# Patient Record
Sex: Female | Born: 1965 | Race: Black or African American | Hispanic: No | Marital: Single | Smoking: Never smoker
Health system: Southern US, Community
[De-identification: ages and names within clinical notes are randomized; demographics above are authoritative.]

## PROBLEM LIST (undated history)

## (undated) DIAGNOSIS — D649 Anemia, unspecified: Secondary | ICD-10-CM

## (undated) DIAGNOSIS — I10 Essential (primary) hypertension: Secondary | ICD-10-CM

---

## 2008-04-12 ENCOUNTER — Ambulatory Visit: Payer: Self-pay | Admitting: Unknown Physician Specialty

## 2009-04-27 ENCOUNTER — Ambulatory Visit: Payer: Self-pay | Admitting: Unknown Physician Specialty

## 2010-06-05 ENCOUNTER — Ambulatory Visit: Payer: Self-pay | Admitting: Unknown Physician Specialty

## 2011-07-17 ENCOUNTER — Ambulatory Visit: Payer: Self-pay | Admitting: Unknown Physician Specialty

## 2012-07-20 ENCOUNTER — Ambulatory Visit: Payer: Self-pay | Admitting: Unknown Physician Specialty

## 2013-07-21 ENCOUNTER — Ambulatory Visit: Payer: Self-pay | Admitting: Internal Medicine

## 2013-07-28 ENCOUNTER — Ambulatory Visit: Payer: Self-pay | Admitting: Internal Medicine

## 2014-07-28 ENCOUNTER — Ambulatory Visit: Payer: Self-pay | Admitting: Family Medicine

## 2016-06-07 ENCOUNTER — Other Ambulatory Visit: Payer: Self-pay | Admitting: Nurse Practitioner

## 2016-06-07 DIAGNOSIS — Z1231 Encounter for screening mammogram for malignant neoplasm of breast: Secondary | ICD-10-CM

## 2016-07-15 ENCOUNTER — Ambulatory Visit
Admission: RE | Admit: 2016-07-15 | Discharge: 2016-07-15 | Disposition: A | Discharge status: Home / Self Care | Payer: Managed Care, Other (non HMO) | Source: Ambulatory Visit | Attending: Nurse Practitioner | Admitting: Nurse Practitioner

## 2016-07-15 DIAGNOSIS — Z1231 Encounter for screening mammogram for malignant neoplasm of breast: Secondary | ICD-10-CM | POA: Diagnosis not present

## 2017-06-23 DIAGNOSIS — I10 Essential (primary) hypertension: Secondary | ICD-10-CM | POA: Diagnosis not present

## 2017-06-23 DIAGNOSIS — Z0000 Encounter for general adult medical examination without abnormal findings: Secondary | ICD-10-CM | POA: Diagnosis not present

## 2017-06-23 DIAGNOSIS — Z79899 Other long term (current) drug therapy: Secondary | ICD-10-CM | POA: Diagnosis not present

## 2017-06-26 ENCOUNTER — Other Ambulatory Visit: Payer: Self-pay | Admitting: Nurse Practitioner

## 2017-06-26 DIAGNOSIS — Z1231 Encounter for screening mammogram for malignant neoplasm of breast: Secondary | ICD-10-CM

## 2017-07-24 ENCOUNTER — Encounter (HOSPITAL_COMMUNITY): Payer: Self-pay

## 2017-07-24 ENCOUNTER — Ambulatory Visit
Admission: RE | Admit: 2017-07-24 | Discharge: 2017-07-24 | Disposition: A | Discharge status: Home / Self Care | Payer: 59 | Source: Ambulatory Visit | Attending: Nurse Practitioner | Admitting: Nurse Practitioner

## 2017-07-24 DIAGNOSIS — Z1231 Encounter for screening mammogram for malignant neoplasm of breast: Secondary | ICD-10-CM | POA: Insufficient documentation

## 2017-11-10 DIAGNOSIS — Z8371 Family history of colonic polyps: Secondary | ICD-10-CM | POA: Diagnosis not present

## 2017-11-10 DIAGNOSIS — Z01818 Encounter for other preprocedural examination: Secondary | ICD-10-CM | POA: Diagnosis not present

## 2017-11-10 DIAGNOSIS — Z1211 Encounter for screening for malignant neoplasm of colon: Secondary | ICD-10-CM | POA: Diagnosis not present

## 2017-11-24 DIAGNOSIS — Z8371 Family history of colonic polyps: Secondary | ICD-10-CM | POA: Diagnosis not present

## 2017-11-24 DIAGNOSIS — K648 Other hemorrhoids: Secondary | ICD-10-CM | POA: Diagnosis not present

## 2017-11-24 DIAGNOSIS — Z1211 Encounter for screening for malignant neoplasm of colon: Secondary | ICD-10-CM | POA: Diagnosis not present

## 2017-11-24 DIAGNOSIS — K640 First degree hemorrhoids: Secondary | ICD-10-CM | POA: Diagnosis not present

## 2017-12-30 DIAGNOSIS — E876 Hypokalemia: Secondary | ICD-10-CM | POA: Diagnosis not present

## 2017-12-30 DIAGNOSIS — I10 Essential (primary) hypertension: Secondary | ICD-10-CM | POA: Diagnosis not present

## 2017-12-30 DIAGNOSIS — Z79899 Other long term (current) drug therapy: Secondary | ICD-10-CM | POA: Diagnosis not present

## 2017-12-30 DIAGNOSIS — E782 Mixed hyperlipidemia: Secondary | ICD-10-CM | POA: Diagnosis not present

## 2018-07-09 ENCOUNTER — Other Ambulatory Visit: Payer: Self-pay | Admitting: Nurse Practitioner

## 2018-07-09 DIAGNOSIS — Z0000 Encounter for general adult medical examination without abnormal findings: Secondary | ICD-10-CM | POA: Diagnosis not present

## 2018-07-09 DIAGNOSIS — I10 Essential (primary) hypertension: Secondary | ICD-10-CM | POA: Diagnosis not present

## 2018-07-09 DIAGNOSIS — Z1231 Encounter for screening mammogram for malignant neoplasm of breast: Secondary | ICD-10-CM

## 2018-07-09 DIAGNOSIS — Z79899 Other long term (current) drug therapy: Secondary | ICD-10-CM | POA: Diagnosis not present

## 2018-07-09 DIAGNOSIS — E782 Mixed hyperlipidemia: Secondary | ICD-10-CM | POA: Diagnosis not present

## 2018-07-31 ENCOUNTER — Ambulatory Visit
Admission: RE | Admit: 2018-07-31 | Discharge: 2018-07-31 | Disposition: A | Discharge status: Home / Self Care | Payer: 59 | Source: Ambulatory Visit | Attending: Nurse Practitioner | Admitting: Nurse Practitioner

## 2018-07-31 DIAGNOSIS — Z1231 Encounter for screening mammogram for malignant neoplasm of breast: Secondary | ICD-10-CM | POA: Diagnosis not present

## 2018-08-05 ENCOUNTER — Other Ambulatory Visit: Payer: Self-pay | Admitting: Nurse Practitioner

## 2018-08-05 DIAGNOSIS — N6310 Unspecified lump in the right breast, unspecified quadrant: Secondary | ICD-10-CM

## 2018-08-05 DIAGNOSIS — R928 Other abnormal and inconclusive findings on diagnostic imaging of breast: Secondary | ICD-10-CM

## 2018-08-07 ENCOUNTER — Ambulatory Visit
Admission: RE | Admit: 2018-08-07 | Discharge: 2018-08-07 | Disposition: A | Discharge status: Home / Self Care | Payer: 59 | Source: Ambulatory Visit | Attending: Nurse Practitioner | Admitting: Nurse Practitioner

## 2018-08-07 DIAGNOSIS — R928 Other abnormal and inconclusive findings on diagnostic imaging of breast: Secondary | ICD-10-CM | POA: Diagnosis present

## 2018-08-07 DIAGNOSIS — N6310 Unspecified lump in the right breast, unspecified quadrant: Secondary | ICD-10-CM | POA: Diagnosis present

## 2018-08-07 DIAGNOSIS — N6313 Unspecified lump in the right breast, lower outer quadrant: Secondary | ICD-10-CM | POA: Diagnosis not present

## 2018-08-07 DIAGNOSIS — R922 Inconclusive mammogram: Secondary | ICD-10-CM | POA: Diagnosis not present

## 2018-12-19 IMAGING — MG MM DIGITAL SCREENING BILAT W/ CAD
4 series · 4 of 4 positions shown · non-contrast
Comparison: Previous exam(s).

CLINICAL DATA: Screening.

EXAM:
DIGITAL SCREENING BILATERAL MAMMOGRAM WITH CAD

[L MLO]
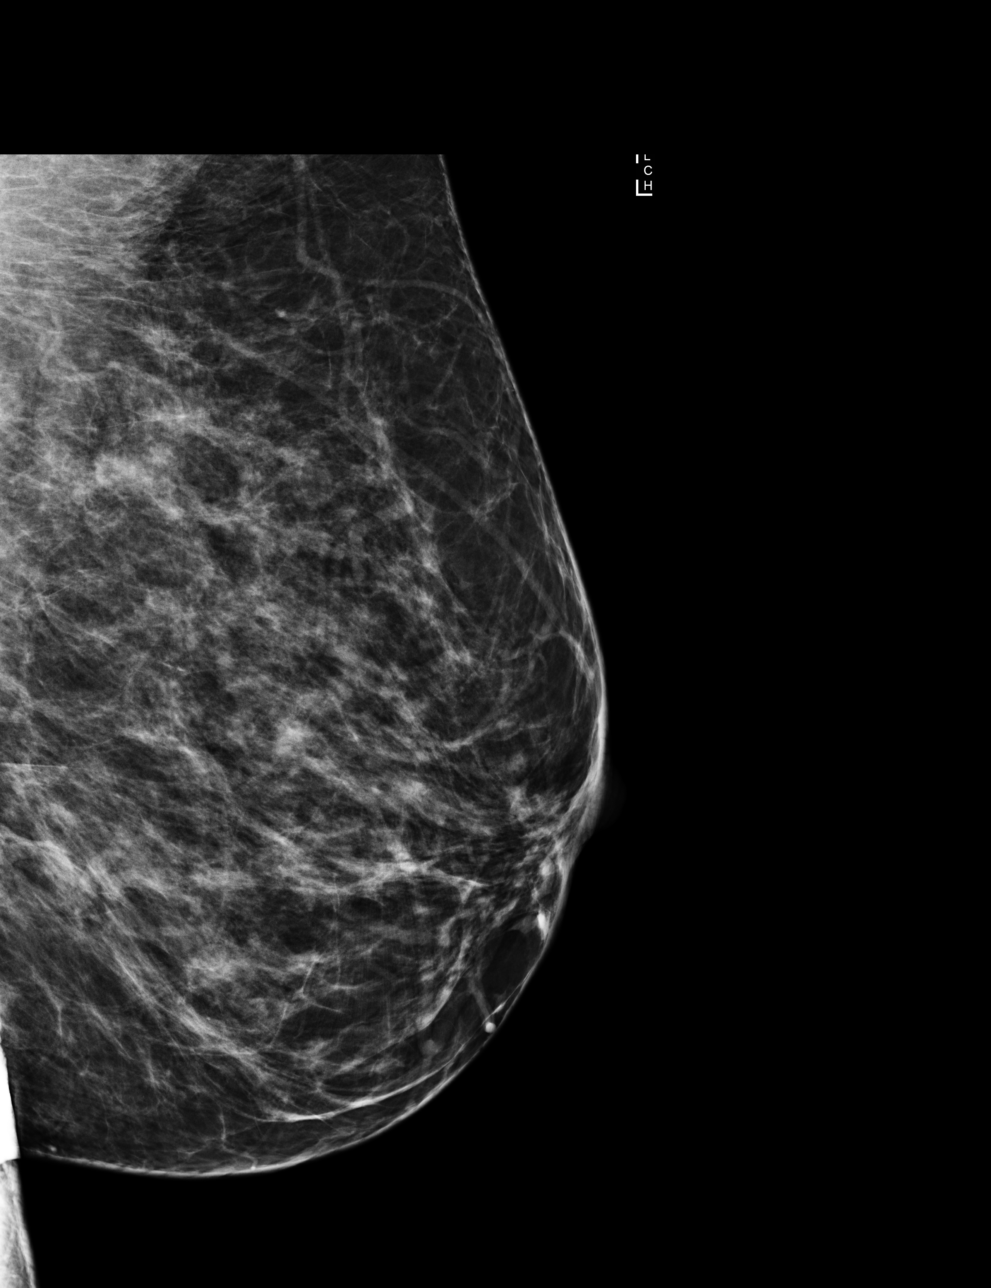

[R MLO]
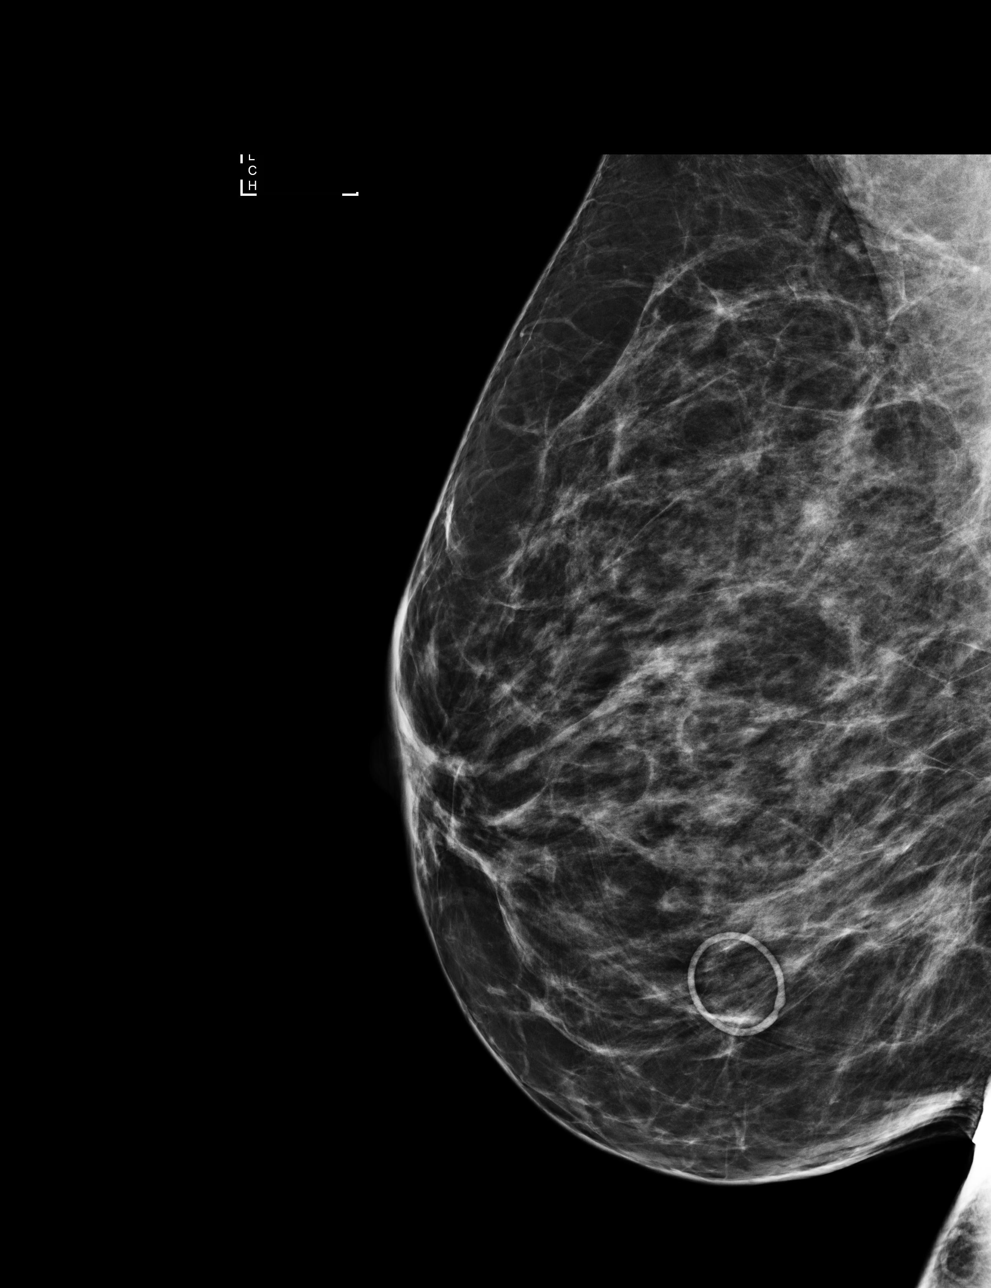

[L CC]
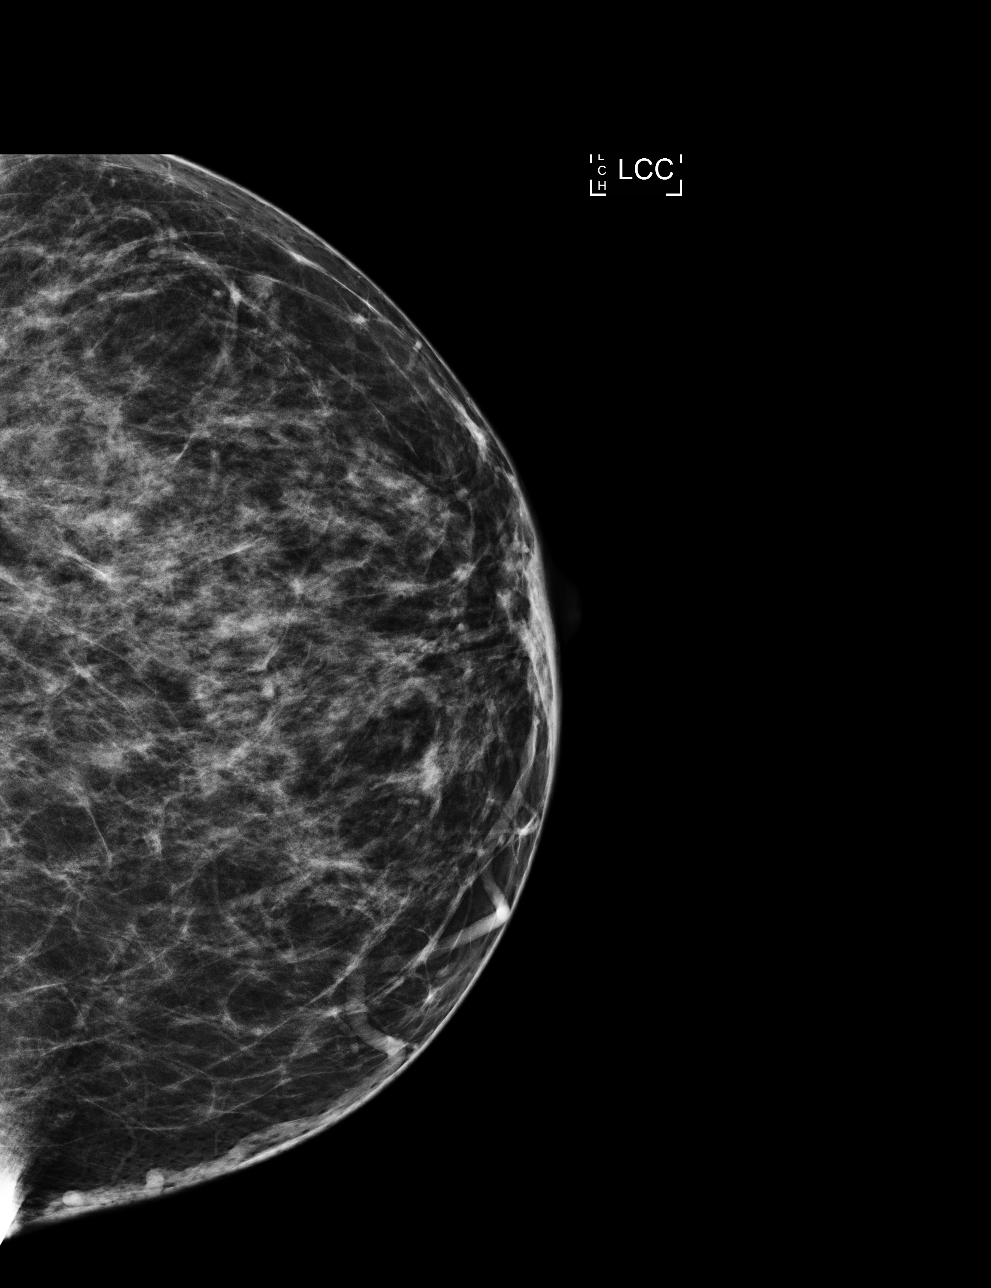

[R CC]
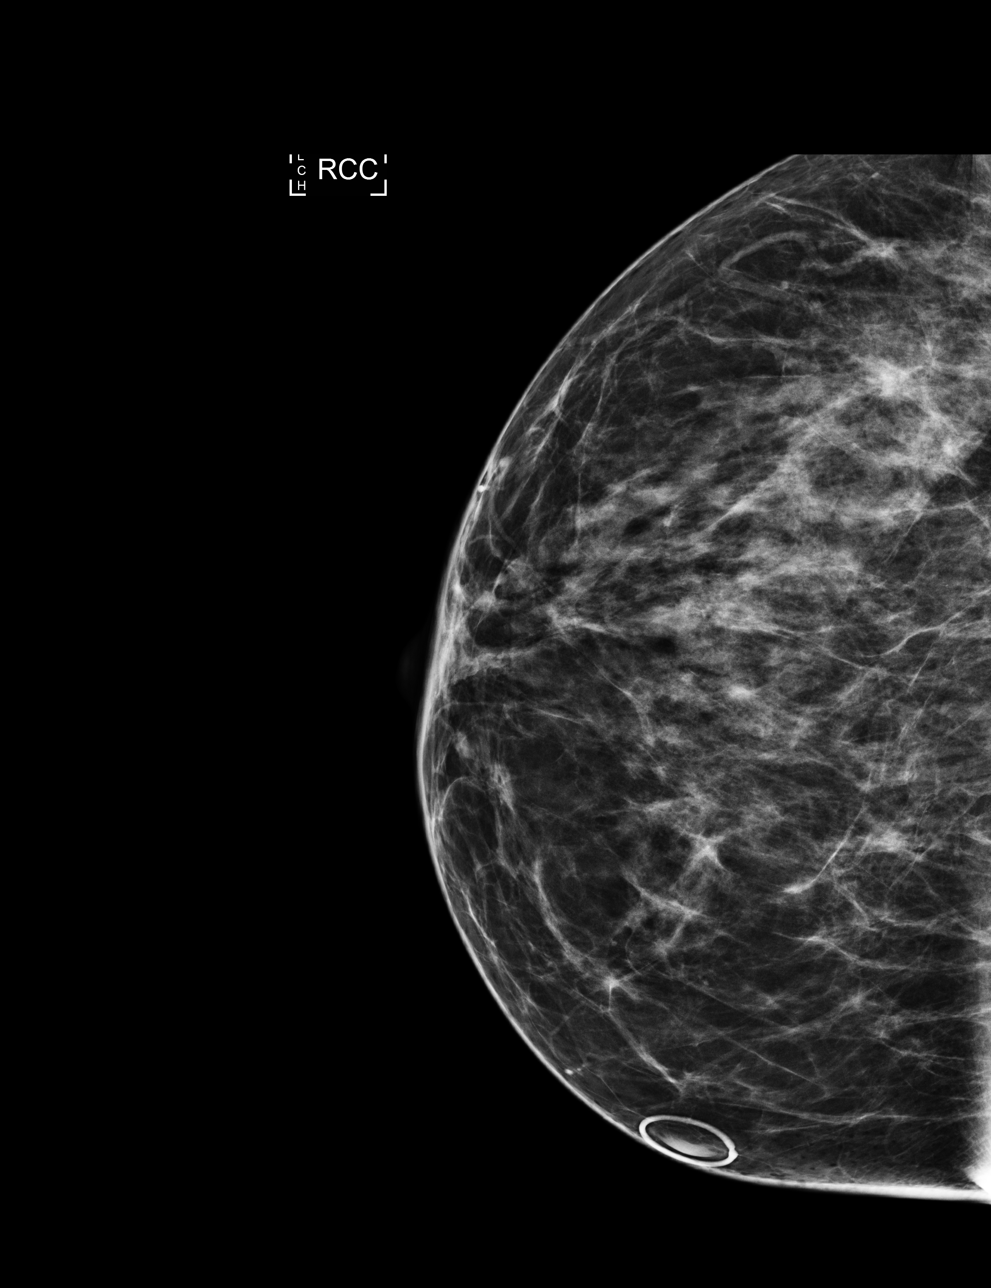

[4 of 4 positions shown; findings below may reference images not displayed]

ACR Breast Density Category c: The breast tissue is heterogeneously
dense, which may obscure small masses.
FINDINGS: There are no findings suspicious for malignancy. Images were
processed with CAD.
IMPRESSION: No mammographic evidence of malignancy. A result letter of this
screening mammogram will be mailed directly to the patient.

RECOMMENDATION:
Screening mammogram in one year. (Code:YJ-[id number])

BI-RADS CATEGORY  1: Negative.

## 2019-08-03 ENCOUNTER — Other Ambulatory Visit: Payer: Self-pay | Admitting: Nurse Practitioner

## 2019-08-03 DIAGNOSIS — Z1231 Encounter for screening mammogram for malignant neoplasm of breast: Secondary | ICD-10-CM

## 2019-08-12 ENCOUNTER — Ambulatory Visit
Admission: RE | Admit: 2019-08-12 | Discharge: 2019-08-12 | Disposition: A | Discharge status: Home / Self Care | Payer: 59 | Source: Ambulatory Visit | Attending: Nurse Practitioner | Admitting: Nurse Practitioner

## 2019-08-12 DIAGNOSIS — Z1231 Encounter for screening mammogram for malignant neoplasm of breast: Secondary | ICD-10-CM | POA: Diagnosis present

## 2019-08-16 ENCOUNTER — Other Ambulatory Visit: Payer: Self-pay | Admitting: Nurse Practitioner

## 2019-08-16 DIAGNOSIS — R928 Other abnormal and inconclusive findings on diagnostic imaging of breast: Secondary | ICD-10-CM

## 2019-08-17 ENCOUNTER — Other Ambulatory Visit: Payer: Self-pay | Admitting: Nurse Practitioner

## 2019-08-17 DIAGNOSIS — R928 Other abnormal and inconclusive findings on diagnostic imaging of breast: Secondary | ICD-10-CM

## 2019-08-17 DIAGNOSIS — N6320 Unspecified lump in the left breast, unspecified quadrant: Secondary | ICD-10-CM

## 2019-08-24 ENCOUNTER — Ambulatory Visit
Admission: RE | Admit: 2019-08-24 | Discharge: 2019-08-24 | Disposition: A | Discharge status: Home / Self Care | Payer: 59 | Source: Ambulatory Visit | Attending: Nurse Practitioner | Admitting: Nurse Practitioner

## 2019-08-24 DIAGNOSIS — N6320 Unspecified lump in the left breast, unspecified quadrant: Secondary | ICD-10-CM | POA: Diagnosis present

## 2019-08-24 DIAGNOSIS — R928 Other abnormal and inconclusive findings on diagnostic imaging of breast: Secondary | ICD-10-CM | POA: Diagnosis present

## 2019-08-30 ENCOUNTER — Other Ambulatory Visit: Payer: Self-pay | Admitting: Nurse Practitioner

## 2019-08-30 DIAGNOSIS — N6320 Unspecified lump in the left breast, unspecified quadrant: Secondary | ICD-10-CM

## 2020-02-08 ENCOUNTER — Other Ambulatory Visit: Payer: Self-pay

## 2020-02-08 ENCOUNTER — Ambulatory Visit
Admission: RE | Admit: 2020-02-08 | Discharge: 2020-02-08 | Disposition: A | Discharge status: Home / Self Care | Payer: 59 | Source: Ambulatory Visit | Attending: Nurse Practitioner | Admitting: Nurse Practitioner

## 2020-02-08 DIAGNOSIS — N6320 Unspecified lump in the left breast, unspecified quadrant: Secondary | ICD-10-CM | POA: Insufficient documentation

## 2020-02-10 ENCOUNTER — Other Ambulatory Visit: Payer: Self-pay | Admitting: Nurse Practitioner

## 2020-02-10 DIAGNOSIS — N63 Unspecified lump in unspecified breast: Secondary | ICD-10-CM

## 2020-02-10 DIAGNOSIS — Z1231 Encounter for screening mammogram for malignant neoplasm of breast: Secondary | ICD-10-CM

## 2020-02-10 DIAGNOSIS — R928 Other abnormal and inconclusive findings on diagnostic imaging of breast: Secondary | ICD-10-CM

## 2020-07-08 HISTORY — PX: BREAST EXCISIONAL BIOPSY: SUR124

## 2020-08-30 ENCOUNTER — Ambulatory Visit
Admission: RE | Admit: 2020-08-30 | Discharge: 2020-08-30 | Disposition: A | Discharge status: Home / Self Care | Payer: 59 | Source: Ambulatory Visit | Attending: Nurse Practitioner | Admitting: Nurse Practitioner

## 2020-08-30 ENCOUNTER — Other Ambulatory Visit: Payer: Self-pay

## 2020-08-30 DIAGNOSIS — R928 Other abnormal and inconclusive findings on diagnostic imaging of breast: Secondary | ICD-10-CM

## 2020-08-30 DIAGNOSIS — N63 Unspecified lump in unspecified breast: Secondary | ICD-10-CM | POA: Diagnosis present

## 2020-08-30 DIAGNOSIS — Z1231 Encounter for screening mammogram for malignant neoplasm of breast: Secondary | ICD-10-CM | POA: Diagnosis present

## 2020-09-01 ENCOUNTER — Other Ambulatory Visit: Payer: Self-pay | Admitting: Nurse Practitioner

## 2020-09-01 DIAGNOSIS — R928 Other abnormal and inconclusive findings on diagnostic imaging of breast: Secondary | ICD-10-CM

## 2020-09-01 DIAGNOSIS — N6320 Unspecified lump in the left breast, unspecified quadrant: Secondary | ICD-10-CM

## 2020-09-05 HISTORY — PX: BREAST BIOPSY: SHX20

## 2020-09-14 ENCOUNTER — Ambulatory Visit
Admission: RE | Admit: 2020-09-14 | Discharge: 2020-09-14 | Disposition: A | Discharge status: Home / Self Care | Payer: 59 | Source: Ambulatory Visit | Attending: Nurse Practitioner | Admitting: Nurse Practitioner

## 2020-09-14 ENCOUNTER — Other Ambulatory Visit: Payer: Self-pay

## 2020-09-14 DIAGNOSIS — R928 Other abnormal and inconclusive findings on diagnostic imaging of breast: Secondary | ICD-10-CM

## 2020-09-14 DIAGNOSIS — N6320 Unspecified lump in the left breast, unspecified quadrant: Secondary | ICD-10-CM | POA: Diagnosis present

## 2020-09-15 LAB — SURGICAL PATHOLOGY

## 2020-09-19 ENCOUNTER — Encounter: Payer: Self-pay | Admitting: *Deleted

## 2020-09-19 NOTE — Progress Notes (Signed)
Received message from Electa Sniff, RN that patient needed surgical consultation for an intraductal papilloma.  Called patient.  Her PCP is at [facility].  I have her scheduled to see Dr. Lysle Pearl on 09/26/20 @ 10:45.

## 2020-10-05 ENCOUNTER — Ambulatory Visit: Payer: Self-pay | Admitting: Surgery

## 2020-10-05 NOTE — H&P (Signed)
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 female who was referred by Sallee Lange, NP for evaluation of above. Change was noted on last screening mammogram. Patient does routinely do self breast exams.  Patient has not noted a change on breast exam. Age of menarche was 68. Patient denies hormonal therapy. Patient is G1P1. Age of first live birth was 22. Patient did not breast feed. Patient denies nipple discharge. Patient denies previous breast biopsy. Patient denies a personal history of breast cancer.   Past Medical History:  has a past medical history of B12 deficiency, History of abnormal cervical Pap smear (02/1987), Hypertension, Hypokalemia, IGT (impaired glucose tolerance) (08/2020), Iron deficiency anemia, Mixed hyperlipidemia (12/30/2017), and Obesity (BMI 30-39.9), unspecified.  Past Surgical History:  has a past surgical history that includes Cesarean section and Colonoscopy (11/24/2017).  Family History: family history includes Alcohol abuse in her father; Aortic aneurysm in her father; Diabetes type II in her paternal grandmother; High blood pressure (Hypertension) in her brother, father, mother, and paternal grandmother; Kidney disease in her brother; No Known Problems in her brother, sister, sister, and sister; Throat cancer in her father.  Social History:  reports that she has never smoked. She has never used smokeless tobacco. She reports previous alcohol use. She reports that she does not use drugs.  Current Medications: has a current medication list which includes the following prescription(s): aranelle (28), aspirin, chlorthalidone, cyanocobalamin (vitamin b-12), and potassium chloride.  Allergies:     Allergies as of 09/26/2020  . (No Known Allergies)    ROS:  A 15 point review of systems was performed and was negative except as noted in HPI   Objective:   BP (!) 142/79   Pulse 61   Ht 167.6 cm (5\' 6" )    Wt (!) 103 kg (227 lb)   BMI 36.64 kg/m   Constitutional :  alert, appears stated age, cooperative and no distress  Lymphatics/Throat:  no asymmetry, masses, or scars  Respiratory:  clear to auscultation bilaterally  Cardiovascular:  regular rate and rhythm  Gastrointestinal: soft, non-tender; bowel sounds normal; no masses,  no organomegaly.   Musculoskeletal: Steady gait and movement  Skin: Cool and moist  Psychiatric: Normal affect, non-agitated, not confused  Breast:  Chaperone present for exam.  breasts appear normal, no suspicious masses, no skin or nipple changes or axillary nodes.    LABS:  SURGICAL PATHOLOGY SURGICAL PATHOLOGY  CASE: [id number]  PATIENT: Olivia Randolph  Surgical Pathology Report      Specimen Submitted:  A. Breast, left   Clinical History: 7 mm mass. Suspect benign. Q-shaped clip placed  following ultrasound guided biopsy of LEFT breast at 3 o'clock.     DIAGNOSIS:  A. BREAST, LEFT AT 3:00, 7 CM FROM THE NIPPLE; ULTRASOUND-GUIDED CORE  NEEDLE BIOPSY:  - FRAGMENTS OF BENIGN INTRADUCTAL PAPILLOMA WITH FLORID USUAL DUCTAL  HYPERPLASIA.  - NO DEFINITE EVIDENCE OF INVASIVE CARCINOMA.   Comment:  Conservative excision is recommended to exclude a potential unsampled  atypical proliferative process.   GROSS DESCRIPTION:  A. Labeled: Left breast 3:00 7 cm from nipple  Received: Formalin  Time/date in fixative: Collected and placed in formalin at 8:16 AM on  09/14/2020  Cold ischemic time: Less than 1 minute  Total fixation time: 9 hours  Core pieces: 3  Size: Range from 1.2-1.4 cm in length and 0.2 cm in diameter  Description: Received are cores of yellow fibrofatty  tissue and  hemorrhage.  Ink color: Blue  Entirely submitted in 1 cassette.    RB 09/14/2020    Final Diagnosis performed by Allena Napoleon, MD.  Electronically signed  09/15/2020 8:42:56AM  The electronic signature indicates that the named Attending Pathologist  has evaluated  the specimen  Technical component performed at [facility], [address], [place],  [place] [address] Lab: [phone] Dir: Rush Farmer, MD, MMM  Professional component performed at [facility], [facility], [place], [place], [place] [address] Lab: [phone]       RADS: ADDENDUM REPORT: 09/15/2020 14:17  ADDENDUM: PATHOLOGY revealed: A. BREAST, LEFT AT 3:00, 7 CM FROM THE NIPPLE; ULTRASOUND-GUIDED CORE NEEDLE BIOPSY: - FRAGMENTS OF BENIGN INTRADUCTAL PAPILLOMA WITH FLORID USUAL DUCTAL HYPERPLASIA. - NO DEFINITE EVIDENCE OF INVASIVE CARCINOMA. Comment: Conservative excision is recommended to exclude a potential unsampled atypical proliferative process.  Pathology results are CONCORDANT with imaging findings, per Dr. Lajean Manes.  Pathology results and recommendations below were discussed with patient by telephone on 09/15/2020. Patient reported biopsy site within normal limits with slight tenderness at the site, and no significant bruising. Post biopsy care instructions were reviewed, questions were answered and my direct phone number was provided to patient. Patient was instructed to call [facility] if any concerns or questions arise related to the biopsy.  Recommend surgical consultation for possible excision: Request for surgical consultation relayed to Al Pimple RN and Tanya Nones RN at [facility] by Electa Sniff RN on 09/15/2020.  Pathology results reported by Electa Sniff RN on 09/15/2020.   Electronically Signed By: Lajean Manes M.D. On: 09/15/2020 14:17 CLINICAL DATA: One year follow-up of probably benign left breast 3  o'clock mass.   EXAM:  DIGITAL DIAGNOSTIC BILATERAL MAMMOGRAM WITH TOMOSYNTHESIS AND CAD;  ULTRASOUND LEFT BREAST LIMITED   TECHNIQUE:  Bilateral digital diagnostic mammography and breast tomosynthesis  was performed. The images were evaluated with computer-aided   detection.; Targeted ultrasound examination of the left breast was  performed   COMPARISON: Previous exam(s).   ACR Breast Density Category c: The breast tissue is heterogeneously  dense, which may obscure small masses.   FINDINGS:  Mammographically, there are no new suspicious masses, areas of  architectural distortion or microcalcifications in either breast.  There is a stable circumscribed low-density mass in the left 3  o'clock breast, middle depth.   Targeted left breast ultrasound was performed showing 3 o'clock 7 cm  from the nipple largely stable in size hypoechoic mass measuring 0.6  x 0.5 x 0.7 cm. However border irregularity is noted on the radial  view, which represents a change from patient's prior ultrasounds.   IMPRESSION:  Largely stable in size but slightly irregular left breast 3 o'clock  mass, for which ultrasound-guided core needle biopsy is recommended.   RECOMMENDATION:  One site ultrasound-guided core needle biopsy of the left breast.   I have discussed the findings and recommendations with the patient.  If applicable, a reminder letter will be sent to the patient  regarding the next appointment.   BI-RADS CATEGORY 3: Probably benign.    Electronically Signed  By: Fidela Salisbury M.D.    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  Plan:   1. 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 The risks of general anesthetic, if used, includes MI, CVA, sudden death or even reaction to anesthetic medications also discussed.  Typical post-op recovery time and possbility of activity restrictions were also discussed.  Alternatives  include continued observation.  Benefits include possible symptom relief, pathologic evaluation, and/or curative excision.   The patient verbalized understanding and all questions were answered to the patient's satisfaction.  2. Pt will like to think over proceeding with excisional biopsy at this time.  Will f/u in 15mo for diagnostic mammogram with primary or our office if she will like to continue monitoring.

## 2020-10-05 NOTE — H&P (View-Only) (Signed)
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 female who was referred by Sallee Lange, NP for evaluation of above. Change was noted on last screening mammogram. Patient does routinely do self breast exams.  Patient has not noted a change on breast exam. Age of menarche was 56. Patient denies hormonal therapy. Patient is G1P1. Age of first live birth was 59. Patient did not breast feed. Patient denies nipple discharge. Patient denies previous breast biopsy. Patient denies a personal history of breast cancer.   Past Medical History:  has a past medical history of B12 deficiency, History of abnormal cervical Pap smear (02/1987), Hypertension, Hypokalemia, IGT (impaired glucose tolerance) (08/2020), Iron deficiency anemia, Mixed hyperlipidemia (12/30/2017), and Obesity (BMI 30-39.9), unspecified.  Past Surgical History:  has a past surgical history that includes Cesarean section and Colonoscopy (11/24/2017).  Family History: family history includes Alcohol abuse in her father; Aortic aneurysm in her father; Diabetes type II in her paternal grandmother; High blood pressure (Hypertension) in her brother, father, mother, and paternal grandmother; Kidney disease in her brother; No Known Problems in her brother, sister, sister, and sister; Throat cancer in her father.  Social History:  reports that she has never smoked. She has never used smokeless tobacco. She reports previous alcohol use. She reports that she does not use drugs.  Current Medications: has a current medication list which includes the following prescription(s): aranelle (28), aspirin, chlorthalidone, cyanocobalamin (vitamin b-12), and potassium chloride.  Allergies:     Allergies as of 09/26/2020  . (No Known Allergies)    ROS:  A 15 point review of systems was performed and was negative except as noted in HPI   Objective:   BP (!) 142/79   Pulse 61   Ht 167.6 cm (5\' 6" )    Wt (!) 103 kg (227 lb)   BMI 36.64 kg/m   Constitutional :  alert, appears stated age, cooperative and no distress  Lymphatics/Throat:  no asymmetry, masses, or scars  Respiratory:  clear to auscultation bilaterally  Cardiovascular:  regular rate and rhythm  Gastrointestinal: soft, non-tender; bowel sounds normal; no masses,  no organomegaly.   Musculoskeletal: Steady gait and movement  Skin: Cool and moist  Psychiatric: Normal affect, non-agitated, not confused  Breast:  Chaperone present for exam.  breasts appear normal, no suspicious masses, no skin or nipple changes or axillary nodes.    LABS:  SURGICAL PATHOLOGY SURGICAL PATHOLOGY  CASE: [id number]  PATIENT: Olivia Randolph  Surgical Pathology Report      Specimen Submitted:  A. Breast, left   Clinical History: 7 mm mass. Suspect benign. Q-shaped clip placed  following ultrasound guided biopsy of LEFT breast at 3 o'clock.     DIAGNOSIS:  A. BREAST, LEFT AT 3:00, 7 CM FROM THE NIPPLE; ULTRASOUND-GUIDED CORE  NEEDLE BIOPSY:  - FRAGMENTS OF BENIGN INTRADUCTAL PAPILLOMA WITH FLORID USUAL DUCTAL  HYPERPLASIA.  - NO DEFINITE EVIDENCE OF INVASIVE CARCINOMA.   Comment:  Conservative excision is recommended to exclude a potential unsampled  atypical proliferative process.   GROSS DESCRIPTION:  A. Labeled: Left breast 3:00 7 cm from nipple  Received: Formalin  Time/date in fixative: Collected and placed in formalin at 8:16 AM on  09/14/2020  Cold ischemic time: Less than 1 minute  Total fixation time: 9 hours  Core pieces: 3  Size: Range from 1.2-1.4 cm in length and 0.2 cm in diameter  Description: Received are cores of yellow fibrofatty  tissue and  hemorrhage.  Ink color: Blue  Entirely submitted in 1 cassette.    RB 09/14/2020    Final Diagnosis performed by Allena Napoleon, MD.  Electronically signed  09/15/2020 8:42:56AM  The electronic signature indicates that the named Attending Pathologist  has evaluated  the specimen  Technical component performed at [facility], [address], [place],  [place] [address] Lab: [phone] Dir: Rush Farmer, MD, MMM  Professional component performed at [facility], [facility], [place], [place], [place] [address] Lab: [phone]       RADS: ADDENDUM REPORT: 09/15/2020 14:17  ADDENDUM: PATHOLOGY revealed: A. BREAST, LEFT AT 3:00, 7 CM FROM THE NIPPLE; ULTRASOUND-GUIDED CORE NEEDLE BIOPSY: - FRAGMENTS OF BENIGN INTRADUCTAL PAPILLOMA WITH FLORID USUAL DUCTAL HYPERPLASIA. - NO DEFINITE EVIDENCE OF INVASIVE CARCINOMA. Comment: Conservative excision is recommended to exclude a potential unsampled atypical proliferative process.  Pathology results are CONCORDANT with imaging findings, per Dr. Lajean Manes.  Pathology results and recommendations below were discussed with patient by telephone on 09/15/2020. Patient reported biopsy site within normal limits with slight tenderness at the site, and no significant bruising. Post biopsy care instructions were reviewed, questions were answered and my direct phone number was provided to patient. Patient was instructed to call [facility] if any concerns or questions arise related to the biopsy.  Recommend surgical consultation for possible excision: Request for surgical consultation relayed to Al Pimple RN and Tanya Nones RN at [facility] by Electa Sniff RN on 09/15/2020.  Pathology results reported by Electa Sniff RN on 09/15/2020.   Electronically Signed By: Lajean Manes M.D. On: 09/15/2020 14:17 CLINICAL DATA: One year follow-up of probably benign left breast 3  o'clock mass.   EXAM:  DIGITAL DIAGNOSTIC BILATERAL MAMMOGRAM WITH TOMOSYNTHESIS AND CAD;  ULTRASOUND LEFT BREAST LIMITED   TECHNIQUE:  Bilateral digital diagnostic mammography and breast tomosynthesis  was performed. The images were evaluated with computer-aided   detection.; Targeted ultrasound examination of the left breast was  performed   COMPARISON: Previous exam(s).   ACR Breast Density Category c: The breast tissue is heterogeneously  dense, which may obscure small masses.   FINDINGS:  Mammographically, there are no new suspicious masses, areas of  architectural distortion or microcalcifications in either breast.  There is a stable circumscribed low-density mass in the left 3  o'clock breast, middle depth.   Targeted left breast ultrasound was performed showing 3 o'clock 7 cm  from the nipple largely stable in size hypoechoic mass measuring 0.6  x 0.5 x 0.7 cm. However border irregularity is noted on the radial  view, which represents a change from patient's prior ultrasounds.   IMPRESSION:  Largely stable in size but slightly irregular left breast 3 o'clock  mass, for which ultrasound-guided core needle biopsy is recommended.   RECOMMENDATION:  One site ultrasound-guided core needle biopsy of the left breast.   I have discussed the findings and recommendations with the patient.  If applicable, a reminder letter will be sent to the patient  regarding the next appointment.   BI-RADS CATEGORY 3: Probably benign.    Electronically Signed  By: Fidela Salisbury M.D.    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  Plan:   1. 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 The risks of general anesthetic, if used, includes MI, CVA, sudden death or even reaction to anesthetic medications also discussed.  Typical post-op recovery time and possbility of activity restrictions were also discussed.  Alternatives  include continued observation.  Benefits include possible symptom relief, pathologic evaluation, and/or curative excision.   The patient verbalized understanding and all questions were answered to the patient's satisfaction.  2. Pt will like to think over proceeding with excisional biopsy at this time.  Will f/u in 60mo for diagnostic mammogram with primary or our office if she will like to continue monitoring.

## 2020-10-18 ENCOUNTER — Other Ambulatory Visit: Payer: Self-pay | Admitting: Surgery

## 2020-10-18 DIAGNOSIS — D242 Benign neoplasm of left breast: Secondary | ICD-10-CM

## 2020-10-19 ENCOUNTER — Other Ambulatory Visit: Payer: Self-pay

## 2020-10-19 ENCOUNTER — Ambulatory Visit
Admission: RE | Admit: 2020-10-19 | Discharge: 2020-10-19 | Disposition: A | Discharge status: Home / Self Care | Payer: 59 | Source: Ambulatory Visit | Attending: Surgery | Admitting: Surgery

## 2020-10-19 DIAGNOSIS — D242 Benign neoplasm of left breast: Secondary | ICD-10-CM | POA: Diagnosis not present

## 2020-10-23 ENCOUNTER — Other Ambulatory Visit
Admission: RE | Admit: 2020-10-23 | Discharge: 2020-10-23 | Disposition: A | Discharge status: Home / Self Care | Payer: 59 | Source: Ambulatory Visit | Attending: Surgery | Admitting: Surgery

## 2020-10-23 ENCOUNTER — Other Ambulatory Visit: Payer: Self-pay

## 2020-10-23 HISTORY — DX: Essential (primary) hypertension: I10

## 2020-10-23 HISTORY — DX: Anemia, unspecified: D64.9

## 2020-10-23 NOTE — Patient Instructions (Signed)
Your procedure is scheduled on: Thursday October 26, 2020. Report to Day Surgery inside [facility] 2nd floor (stop by admissions desk first before getting on elevator). To find out your arrival time please call [phone] between 1PM - 3PM on Wednesday October 25, 2020.  Remember: Instructions that are not followed completely may result in serious medical risk,  up to and including death, or upon the discretion of your surgeon and anesthesiologist your  surgery may need to be rescheduled.     _X__ 1. Do not eat food after midnight the night before your procedure.                 No chewing gum or hard candies. You may drink clear liquids up to 2 hours                 before you are scheduled to arrive for your surgery- DO not drink clear                 liquids within 2 hours of the start of your surgery.                 Clear Liquids include:  water, apple juice without pulp, clear Gatorade, G2 or                  Gatorade Zero (avoid Red/Purple/Blue), Black Coffee or Tea (Do not add                 anything to coffee or tea).  __X__2.  On the morning of surgery brush your teeth with toothpaste and water, you                may rinse your mouth with mouthwash if you wish.  Do not swallow any toothpaste of mouthwash.     _X__ 3.  No Alcohol for 24 hours before or after surgery.   _X__ 4.  Do Not Smoke or use e-cigarettes For 24 Hours Prior to Your Surgery.                 Do not use any chewable tobacco products for at least 6 hours prior to                 Surgery.  _X__  5.  Do not use any recreational drugs (marijuana, cocaine, heroin, ecstasy, MDMA or other)                For at least one week prior to your surgery.  Combination of these drugs with anesthesia                May have life threatening results.  __X__ 6.  Notify your doctor if there is any change in your medical condition      (cold, fever, infections).     Do not wear jewelry, make-up,  hairpins, clips or nail polish. Do not wear lotions, powders, or perfumes. You may wear deodorant. Do not shave 48 hours prior to surgery.  Do not bring valuables to the hospital.    [facility] is not responsible for any belongings or valuables.  Contacts, dentures or bridgework may not be worn into surgery. Leave your suitcase in the car. After surgery it may be brought to your room. For patients admitted to the hospital, discharge time is determined by your treatment team.   Patients discharged the day of surgery will not be allowed to drive home.   Make arrangements  for someone to be with you for the first 24 hours of your Same Day Discharge.   __X__ Take these medicines the morning of surgery with A SIP OF WATER:    1. None   2.   3.   4.  5.  6.  ____ Fleet Enema (as directed)   __X__ Use CHG Soap (or wipes) as directed  ____ Use Benzoyl Peroxide Gel as instructed  ____ Use inhalers on the day of surgery  ____ Stop metformin 2 days prior to surgery    ____ Take 1/2 of usual insulin dose the night before surgery. No insulin the morning          of surgery.   __X__ Stop aspirin EC 81 MG as instructed.  __X__ Stop Anti-inflammatories such as Ibuprofen, Aleve, Advil, motrin, Goody's or BC powders.   __X__ Stop supplements until after surgery.    __X__ Do not start any herbal supplements before your procedure.    If you have any questions regarding your pre-procedure instructions,  Please call Pre-admit Testing at [phone].

## 2020-10-24 ENCOUNTER — Other Ambulatory Visit
Admission: RE | Admit: 2020-10-24 | Discharge: 2020-10-24 | Disposition: A | Discharge status: Home / Self Care | Payer: 59 | Source: Ambulatory Visit | Attending: Surgery | Admitting: Surgery

## 2020-10-24 DIAGNOSIS — I10 Essential (primary) hypertension: Secondary | ICD-10-CM | POA: Diagnosis not present

## 2020-10-24 DIAGNOSIS — Z01818 Encounter for other preprocedural examination: Secondary | ICD-10-CM | POA: Insufficient documentation

## 2020-10-24 LAB — BASIC METABOLIC PANEL
Anion gap: 9 (ref 5–15)
BUN: 16 mg/dL (ref 6–20)
CO2: 27 mmol/L (ref 22–32)
Calcium: 8.9 mg/dL (ref 8.9–10.3)
Chloride: 101 mmol/L (ref 98–111)
Creatinine, Ser: 0.83 mg/dL (ref 0.44–1.00)
GFR, Estimated: >60 mL/min (ref >60)
Glucose, Bld: 91 mg/dL (ref 70–99)
Potassium: 3 mmol/L — ABNORMAL LOW (ref 3.5–5.1)
Sodium: 137 mmol/L (ref 135–145)

## 2020-10-24 LAB — CBC
HCT: 36.7 % (ref 36.0–46.0)
Hemoglobin: 12.1 g/dL (ref 12.0–15.0)
MCH: 28.1 pg (ref 26.0–34.0)
MCHC: 33 g/dL (ref 30.0–36.0)
MCV: 85.2 fL (ref 80.0–100.0)
Platelets: 293 10*3/uL (ref 150–400)
RBC: 4.31 MIL/uL (ref 3.87–5.11)
RDW: 14.2 % (ref 11.5–15.5)
WBC: 7 10*3/uL (ref 4.0–10.5)
nRBC: 0 % (ref 0.0–0.2)

## 2020-10-24 NOTE — Progress Notes (Signed)
  [facility] Perioperative Services: Pre-Admission/Anesthesia Testing  Abnormal Lab Notification   Date: 10/24/20  Name: Olivia Randolph MRN:   [id number]  Re: Abnormal labs noted during PAT appointment   Provider(s) Notified: Benjamine Sprague, DO Notification mode: Routed and/or faxed via [facility] LAB VALUE(S): Lab Results  Component Value Date   K 3.0 (L) 10/24/2020   Notes:  Patient is scheduled for a BREAT BIOPSY on 10/26/2020. In review of patient's medication record it is noted that she is on daily chlorthalidone. Additionally, patient already taking oral K+ supplement (KLOR-CON 20 mEQ) daily. Will forward to primary attending surgery for review and optimization. Order entered to have K+ rechecked on the day of surgery to ensure correction of the noted electrolyte derangement.   This is a Community education officer; no formal response is required.  Honor Loh, MSN, APRN, FNP-C, CEN [facility]  Peri-operative Services Nurse Practitioner Phone: [phone] Fax: [phone] 10/24/20 2:30 PM

## 2020-10-26 ENCOUNTER — Ambulatory Visit
Admission: RE | Admit: 2020-10-26 | Discharge: 2020-10-26 | Disposition: A | Discharge status: Home / Self Care | Payer: 59 | Source: Ambulatory Visit | Attending: Surgery | Admitting: Surgery

## 2020-10-26 ENCOUNTER — Ambulatory Visit
Admission: RE | Admit: 2020-10-26 | Discharge: 2020-10-26 | Disposition: A | Discharge status: Home Health | Payer: 59 | Source: Ambulatory Visit | Attending: Surgery | Admitting: Surgery

## 2020-10-26 ENCOUNTER — Other Ambulatory Visit: Payer: Self-pay

## 2020-10-26 ENCOUNTER — Ambulatory Visit: Payer: 59 | Admitting: Urgent Care

## 2020-10-26 ENCOUNTER — Encounter: Payer: Self-pay | Admitting: Surgery

## 2020-10-26 ENCOUNTER — Encounter
Admission: RE | Disposition: A | Discharge status: Home Health | Payer: Self-pay | Source: Ambulatory Visit | Attending: Surgery

## 2020-10-26 DIAGNOSIS — D242 Benign neoplasm of left breast: Secondary | ICD-10-CM | POA: Diagnosis not present

## 2020-10-26 DIAGNOSIS — Z7982 Long term (current) use of aspirin: Secondary | ICD-10-CM | POA: Insufficient documentation

## 2020-10-26 DIAGNOSIS — Z79899 Other long term (current) drug therapy: Secondary | ICD-10-CM | POA: Insufficient documentation

## 2020-10-26 DIAGNOSIS — Z793 Long term (current) use of hormonal contraceptives: Secondary | ICD-10-CM | POA: Insufficient documentation

## 2020-10-26 DIAGNOSIS — N63 Unspecified lump in unspecified breast: Secondary | ICD-10-CM

## 2020-10-26 HISTORY — PX: BREAST BIOPSY WITH RADIO FREQUENCY LOCALIZER: SHX6895

## 2020-10-26 LAB — POCT I-STAT, CHEM 8
BUN: 18 mg/dL (ref 6–20)
Calcium, Ion: 1.12 mmol/L — ABNORMAL LOW (ref 1.15–1.40)
Chloride: 101 mmol/L (ref 98–111)
Creatinine, Ser: 0.8 mg/dL (ref 0.44–1.00)
Glucose, Bld: 81 mg/dL (ref 70–99)
HCT: 37 % (ref 36.0–46.0)
Hemoglobin: 12.6 g/dL (ref 12.0–15.0)
Potassium: 2.7 mmol/L — CL (ref 3.5–5.1)
Sodium: 141 mmol/L (ref 135–145)
TCO2: 25 mmol/L (ref 22–32)

## 2020-10-26 LAB — POTASSIUM: Potassium: 3.1 mmol/L — ABNORMAL LOW (ref 3.5–5.1)

## 2020-10-26 LAB — POCT PREGNANCY, URINE: Preg Test, Ur: NEGATIVE

## 2020-10-26 SURGERY — BREAST BIOPSY WITH RADIO FREQUENCY LOCALIZER
Anesthesia: General | Site: Breast | Laterality: Left

## 2020-10-26 MED ORDER — BUPIVACAINE-EPINEPHRINE 0.5% -1:200000 IJ SOLN
INTRAMUSCULAR | Status: DC | PRN
  Administered 2020-10-26: 4.5 mL

## 2020-10-26 MED ORDER — CHLORHEXIDINE GLUCONATE 0.12 % MT SOLN
OROMUCOSAL | Status: AC
  Administered 2020-10-26: 15 mL via OROMUCOSAL
  Filled 2020-10-26: qty 15

## 2020-10-26 MED ORDER — ACETAMINOPHEN 10 MG/ML IV SOLN
INTRAVENOUS | Status: DC | PRN
  Administered 2020-10-26: 1000 mg via INTRAVENOUS

## 2020-10-26 MED ORDER — DEXAMETHASONE SODIUM PHOSPHATE 10 MG/ML IJ SOLN
INTRAMUSCULAR | Status: DC | PRN
  Administered 2020-10-26: 10 mg via INTRAVENOUS

## 2020-10-26 MED ORDER — DROPERIDOL 2.5 MG/ML IJ SOLN
0.6250 mg | Freq: Once | INTRAMUSCULAR | Status: DC | PRN
  Filled 2020-10-26: qty 2

## 2020-10-26 MED ORDER — MEPERIDINE HCL 50 MG/ML IJ SOLN: 6.2500 mg | INTRAMUSCULAR | Status: DC | PRN

## 2020-10-26 MED ORDER — ACETAMINOPHEN 10 MG/ML IV SOLN
INTRAVENOUS | Status: AC
  Filled 2020-10-26: qty 100

## 2020-10-26 MED ORDER — PROPOFOL 10 MG/ML IV BOLUS
INTRAVENOUS | Status: AC
  Filled 2020-10-26: qty 20

## 2020-10-26 MED ORDER — PROPOFOL 10 MG/ML IV BOLUS
INTRAVENOUS | Status: DC | PRN
  Administered 2020-10-26 (×2): 100 mg via INTRAVENOUS

## 2020-10-26 MED ORDER — FAMOTIDINE 20 MG PO TABS: 20.0000 mg | ORAL_TABLET | Freq: Once | ORAL | Status: AC

## 2020-10-26 MED ORDER — CEFAZOLIN SODIUM-DEXTROSE 2-4 GM/100ML-% IV SOLN
2.0000 g | INTRAVENOUS | Status: AC
  Administered 2020-10-26: 2 g via INTRAVENOUS

## 2020-10-26 MED ORDER — LIDOCAINE HCL (CARDIAC) PF 100 MG/5ML IV SOSY
PREFILLED_SYRINGE | INTRAVENOUS | Status: DC | PRN
  Administered 2020-10-26: 60 mg via INTRAVENOUS

## 2020-10-26 MED ORDER — DOCUSATE SODIUM 100 MG PO CAPS: 100.0000 mg | ORAL_CAPSULE | Freq: Two times a day (BID) | ORAL | 0 refills | Status: AC | PRN

## 2020-10-26 MED ORDER — ONDANSETRON HCL 4 MG/2ML IJ SOLN
INTRAMUSCULAR | Status: DC | PRN
  Administered 2020-10-26 (×2): 4 mg via INTRAVENOUS

## 2020-10-26 MED ORDER — MIDAZOLAM HCL 2 MG/2ML IJ SOLN
INTRAMUSCULAR | Status: DC | PRN
  Administered 2020-10-26: 3 mg via INTRAVENOUS
  Administered 2020-10-26: 1 mg via INTRAVENOUS

## 2020-10-26 MED ORDER — FENTANYL CITRATE (PF) 100 MCG/2ML IJ SOLN: 25.0000 ug | INTRAMUSCULAR | Status: DC | PRN

## 2020-10-26 MED ORDER — BUPIVACAINE-EPINEPHRINE (PF) 0.5% -1:200000 IJ SOLN
INTRAMUSCULAR | Status: AC
  Filled 2020-10-26: qty 30

## 2020-10-26 MED ORDER — LIDOCAINE HCL (PF) 1 % IJ SOLN
INTRAMUSCULAR | Status: AC
  Filled 2020-10-26: qty 30

## 2020-10-26 MED ORDER — IBUPROFEN 800 MG PO TABS: 800.0000 mg | ORAL_TABLET | Freq: Three times a day (TID) | ORAL | 0 refills | Status: AC | PRN

## 2020-10-26 MED ORDER — CHLORHEXIDINE GLUCONATE 0.12 % MT SOLN: 15.0000 mL | Freq: Once | OROMUCOSAL | Status: AC

## 2020-10-26 MED ORDER — MIDAZOLAM HCL 2 MG/2ML IJ SOLN
INTRAMUSCULAR | Status: AC
  Filled 2020-10-26: qty 4

## 2020-10-26 MED ORDER — PROMETHAZINE HCL 25 MG/ML IJ SOLN: 6.2500 mg | INTRAMUSCULAR | Status: DC | PRN

## 2020-10-26 MED ORDER — LIDOCAINE HCL (PF) 1 % IJ SOLN
INTRAMUSCULAR | Status: DC | PRN
  Administered 2020-10-26: 4.5 mL

## 2020-10-26 MED ORDER — FENTANYL CITRATE (PF) 100 MCG/2ML IJ SOLN
INTRAMUSCULAR | Status: AC
  Filled 2020-10-26: qty 2

## 2020-10-26 MED ORDER — HYDROCODONE-ACETAMINOPHEN 5-325 MG PO TABS: 1.0000 | ORAL_TABLET | Freq: Four times a day (QID) | ORAL | 0 refills | Status: AC | PRN

## 2020-10-26 MED ORDER — ACETAMINOPHEN 325 MG PO TABS: 325.0000 mg | ORAL_TABLET | ORAL | Status: DC | PRN

## 2020-10-26 MED ORDER — LACTATED RINGERS IV SOLN: INTRAVENOUS | Status: DC

## 2020-10-26 MED ORDER — HYDROCODONE-ACETAMINOPHEN 7.5-325 MG PO TABS: 1.0000 | ORAL_TABLET | Freq: Once | ORAL | Status: DC | PRN

## 2020-10-26 MED ORDER — FENTANYL CITRATE (PF) 100 MCG/2ML IJ SOLN
INTRAMUSCULAR | Status: DC | PRN
  Administered 2020-10-26: 25 ug via INTRAVENOUS
  Administered 2020-10-26: 50 ug via INTRAVENOUS
  Administered 2020-10-26: 25 ug via INTRAVENOUS

## 2020-10-26 MED ORDER — CEFAZOLIN SODIUM-DEXTROSE 2-4 GM/100ML-% IV SOLN
INTRAVENOUS | Status: AC
  Filled 2020-10-26: qty 100

## 2020-10-26 MED ORDER — ORAL CARE MOUTH RINSE: 15.0000 mL | Freq: Once | OROMUCOSAL | Status: AC

## 2020-10-26 MED ORDER — CHLORHEXIDINE GLUCONATE CLOTH 2 % EX PADS: 6.0000 | MEDICATED_PAD | Freq: Once | CUTANEOUS | Status: DC

## 2020-10-26 MED ORDER — ACETAMINOPHEN 325 MG PO TABS: 650.0000 mg | ORAL_TABLET | Freq: Three times a day (TID) | ORAL | 0 refills | Status: AC | PRN

## 2020-10-26 MED ORDER — FAMOTIDINE 20 MG PO TABS
ORAL_TABLET | ORAL | Status: AC
  Administered 2020-10-26: 20 mg via ORAL
  Filled 2020-10-26: qty 1

## 2020-10-26 MED ORDER — ACETAMINOPHEN 160 MG/5ML PO SOLN
325.0000 mg | ORAL | Status: DC | PRN
Start: 2020-10-26 — End: 2020-10-26
  Filled 2020-10-26: qty 20.3

## 2020-10-26 SURGICAL SUPPLY — 50 items
ADH SKN CLS APL DERMABOND .7 (GAUZE/BANDAGES/DRESSINGS) ×1
APL PRP STRL LF DISP 70% ISPRP (MISCELLANEOUS) ×1
APPLIER CLIP 11 MED OPEN (CLIP)
APR CLP MED 11 20 MLT OPN (CLIP)
BLADE SURG 15 STRL LF DISP TIS (BLADE) ×1 IMPLANT
BLADE SURG 15 STRL SS (BLADE) ×2
CANISTER SUCT 1200ML W/VALVE (MISCELLANEOUS) ×2 IMPLANT
CHLORAPREP W/TINT 26 (MISCELLANEOUS) ×2 IMPLANT
CLIP APPLIE 11 MED OPEN (CLIP) IMPLANT
CNTNR SPEC 2.5X3XGRAD LEK (MISCELLANEOUS) ×1
CONT SPEC 4OZ STER OR WHT (MISCELLANEOUS) ×1
CONT SPEC 4OZ STRL OR WHT (MISCELLANEOUS) ×1
CONTAINER SPEC 2.5X3XGRAD LEK (MISCELLANEOUS) ×1 IMPLANT
COVER WAND RF STERILE (DRAPES) ×2 IMPLANT
DERMABOND ADVANCED (GAUZE/BANDAGES/DRESSINGS) ×1
DERMABOND ADVANCED .7 DNX12 (GAUZE/BANDAGES/DRESSINGS) ×1 IMPLANT
DEVICE DSSCT PLSMBLD 3.0S LGHT (MISCELLANEOUS) ×1 IMPLANT
DEVICE DUBIN SPECIMEN MAMMOGRA (MISCELLANEOUS) ×2 IMPLANT
DRAPE LAPAROTOMY TRNSV 106X77 (MISCELLANEOUS) ×2 IMPLANT
ELECT CAUTERY BLADE TIP 2.5 (TIP) ×2
ELECT REM PT RETURN 9FT ADLT (ELECTROSURGICAL) ×2
ELECTRODE CAUTERY BLDE TIP 2.5 (TIP) ×1 IMPLANT
ELECTRODE REM PT RTRN 9FT ADLT (ELECTROSURGICAL) ×1 IMPLANT
GAUZE 4X4 16PLY RFD (DISPOSABLE) ×2 IMPLANT
GLOVE SURG SYN 6.5 ES PF (GLOVE) ×2 IMPLANT
GLOVE SURG UNDER POLY LF SZ7 (GLOVE) ×2 IMPLANT
GOWN STRL REUS W/ TWL LRG LVL3 (GOWN DISPOSABLE) ×3 IMPLANT
GOWN STRL REUS W/TWL LRG LVL3 (GOWN DISPOSABLE) ×6
JACKSON PRATT 10 (INSTRUMENTS) IMPLANT
KIT MARKER MARGIN INK (KITS) ×2 IMPLANT
KIT TURNOVER KIT A (KITS) ×2 IMPLANT
LABEL OR SOLS (LABEL) ×2 IMPLANT
LIGHT WAVEGUIDE WIDE FLAT (MISCELLANEOUS) IMPLANT
MANIFOLD NEPTUNE II (INSTRUMENTS) ×2 IMPLANT
MARKER MARGIN CORRECT CLIP (MARKER) ×2 IMPLANT
NEEDLE HYPO 22GX1.5 SAFETY (NEEDLE) ×4 IMPLANT
PACK BASIN MINOR ARMC (MISCELLANEOUS) ×2 IMPLANT
PLASMABLADE 3.0S W/LIGHT (MISCELLANEOUS) ×2
SET LOCALIZER 20 PROBE US (MISCELLANEOUS) ×2 IMPLANT
SUT MNCRL 4-0 (SUTURE) ×6
SUT MNCRL 4-0 27XMFL (SUTURE) ×3
SUT SILK 2 0 (SUTURE) ×2
SUT SILK 2-0 30XBRD TIE 12 (SUTURE) ×1 IMPLANT
SUT SILK 3 0 12 30 (SUTURE) IMPLANT
SUT VIC AB 3-0 SH 27 (SUTURE) ×6
SUT VIC AB 3-0 SH 27X BRD (SUTURE) ×3 IMPLANT
SUTURE MNCRL 4-0 27XMF (SUTURE) ×3 IMPLANT
SYR 20ML LL LF (SYRINGE) ×2 IMPLANT
TUBING CONNECTING 10 (TUBING) ×2 IMPLANT
WATER STERILE IRR 1000ML POUR (IV SOLUTION) ×2 IMPLANT

## 2020-10-26 NOTE — Anesthesia Preprocedure Evaluation (Addendum)
Anesthesia Evaluation  Patient identified by MRN, date of birth, ID band Patient awake    Reviewed: Allergy & Precautions, H&P , NPO status , reviewed documented beta blocker date and time   Airway Mallampati: II  TM Distance: >3 FB Neck ROM: full    Dental  (+) Teeth Intact   Pulmonary    Pulmonary exam normal        Cardiovascular hypertension, Normal cardiovascular exam     Neuro/Psych    GI/Hepatic neg GERD  ,  Endo/Other    Renal/GU      Musculoskeletal   Abdominal   Peds  Hematology  (+) Blood dyscrasia, anemia ,   Anesthesia Other Findings Past Medical History: No date: Anemia No date: Hypertension Past Surgical History: 1988: CESAREAN SECTION   Reproductive/Obstetrics                            Anesthesia Physical Anesthesia Plan  ASA: II  Anesthesia Plan: General   Post-op Pain Management:    Induction: Intravenous  PONV Risk Score and Plan: Ondansetron and Treatment may vary due to age or medical condition  Airway Management Planned: LMA  Additional Equipment:   Intra-op Plan:   Post-operative Plan: Extubation in OR  Informed Consent: I have reviewed the patients History and Physical, chart, labs and discussed the procedure including the risks, benefits and alternatives for the proposed anesthesia with the patient or authorized representative who has indicated his/her understanding and acceptance.     Dental Advisory Given  Plan Discussed with: CRNA  Anesthesia Plan Comments:         Anesthesia Quick Evaluation

## 2020-10-26 NOTE — Interval H&P Note (Signed)
History and Physical Interval Note:  10/26/2020 1:32 PM  Olivia Randolph  has presented today for surgery, with the diagnosis of Papilloma of left breast D24.2.  The various methods of treatment have been discussed with the patient and family. After consideration of risks, benefits and other options for treatment, the patient has consented to  Procedure(s): BREAST BIOPSY WITH RADIO FREQUENCY LOCALIZER (Left) as a surgical intervention.  The patient's history has been reviewed, patient examined, no change in status, stable for surgery.  I have reviewed the patient's chart and labs.  Questions were answered to the patient's satisfaction.     Shaila Gilchrest Lysle Pearl

## 2020-10-26 NOTE — Discharge Instructions (Signed)
Removal, Care After This sheet gives you information about how to care for yourself after your procedure. Your health care provider may also give you more specific instructions. If you have problems or questions, contact your health care provider. What can I expect after the procedure? After the procedure, it is common to have:  Soreness.  Bruising.  Itching. Follow these instructions at home: site care Follow instructions from your health care provider about how to take care of your site. Make sure you:  Wash your hands with soap and water before and after you change your bandage (dressing). If soap and water are not available, use hand sanitizer.  Leave stitches (sutures), skin glue, or adhesive strips in place. These skin closures may need to stay in place for 2 weeks or longer. If adhesive strip edges start to loosen and curl up, you may trim the loose edges. Do not remove adhesive strips completely unless your health care provider tells you to do that.  If the area bleeds or bruises, apply gentle pressure for 10 minutes.  OK TO SHOWER IN 24HRS  Check your site every day for signs of infection. Check for:  Redness, swelling, or pain.  Fluid or blood.  Warmth.  Pus or a bad smell.  General instructions  Rest and then return to your normal activities as told by your health care provider. . RESUME ASPIRIN IN 48HRS .  tylenol and advil as needed for discomfort.  Please alternate between the two every four hours as needed for pain.   .  Use narcotics, if prescribed, only when tylenol and motrin is not enough to control pain. .  325-650mg  every 8hrs to max of 3000mg /24hrs (including the 325mg  in every norco dose) for the tylenol.   .  Advil up to 800mg  per dose every 8hrs as needed for pain.    Keep all follow-up visits as told by your health care provider. This is important. Contact a health care provider if:  You have redness, swelling, or pain around your site.  You  have fluid or blood coming from your site.  Your site feels warm to the touch.  You have pus or a bad smell coming from your site.  You have a fever.  Your sutures, skin glue, or adhesive strips loosen or come off sooner than expected. Get help right away if:  You have bleeding that does not stop with pressure or a dressing. Summary  After the procedure, it is common to have some soreness, bruising, and itching at the site.  Follow instructions from your health care provider about how to take care of your site.  Check your site every day for signs of infection.  Contact a health care provider if you have redness, swelling, or pain around your site, or your site feels warm to the touch.  Keep all follow-up visits as told by your health care provider. This is important. This information is not intended to replace advice given to you by your health care provider. Make sure you discuss any questions you have with your health care provider. Document Released: 07/21/2015 Document Revised: 12/22/2017 Document Reviewed: 12/22/2017 Elsevier Interactive Patient Education  2019 [employer]   1) The drugs that you were given will stay in your system until tomorrow so for the next 24 hours you should not:  A) Drive an automobile B) Make any legal decisions C) Drink any alcoholic beverage   2) You may resume regular  meals tomorrow.  Today it is better to start with liquids and gradually work up to solid foods.  You may eat anything you prefer, but it is better to start with liquids, then soup and crackers, and gradually work up to solid foods.   3) Please notify your doctor immediately if you have any unusual bleeding, trouble breathing, redness and pain at the surgery site, drainage, fever, or pain not relieved by medication.    4) Additional Instructions:        Please contact your physician with any problems or Same Day  Surgery at [phone], Monday through Friday 6 am to 4 pm, or [place] [facility] number at [phone].

## 2020-10-26 NOTE — Op Note (Signed)
Preoperative diagnosis:  left breast papilloma  Postoperative diagnosis: SAME.   Procedure: RF tag-localized left breast lumpectomy  Anesthesia: GETA  Surgeon: Dr. Benjamine Sprague  Wound Classification: Clean  Indications: Patient is a 55 y.o. female with a nonpalpable left  breast mass noted on mammography with core biopsy demonstrating papilloma requires RF localizer placement, lumpectomy to ensure no malignancy  Specimen: left Breast mass,  Complications: None  Estimated Blood Loss: 9mL  Findings: 1. Specimen mammography shows marker and RF localizer on specimen 2. Pathology call refers gross examination of margins was negative 3. No other palpable mass  identified.   Description of procedure: RF localization was performed by radiology prior to procedure.  Localization studies were reviewed. The patient was taken to the operating room and placed supine on the operating table, and after general anesthesia the left  breast and axilla were prepped and draped in the usual sterile fashion. A time-out was completed verifying correct patient, procedure, site, positioning, and implant(s) and/or special equipment prior to beginning this procedure.  By identifying the RF localizer, the probable trajectory and location of the mass was visualized. A skin incision was planned in such a way as to minimize the amount of dissection to reach the mass.  The skin incision was made after infusion of local. Flaps were raised and  Sharp and blunt dissection was then taken down to the mass, taking care to include the entire RF localizer and a margin of grossly normal tissue. The specimen was removed. The specimen was oriented with paint and sent to radiology with the localization studies. Confirmation was received that the entire target lesion had been resected. Wound irrigated, hemostasis was achieved and the wound closed in layers with  interrupted sutures of 3-0 Vicryl in deep dermal layer and a running  subcuticular suture of Monocryl 4-0, then dressed with dermabond. The patient tolerated the procedure well and was taken to the postanesthesia care unit in stable condition. Sponge and instrument count correct at end of procedure.

## 2020-10-26 NOTE — Transfer of Care (Signed)
Immediate Anesthesia Transfer of Care Note  Patient: Olivia Randolph  Procedure(s) Performed: BREAST BIOPSY WITH RADIO FREQUENCY LOCALIZER (Left Breast)  Patient Location: PACU  Anesthesia Type:General  Level of Consciousness: awake, alert  and oriented  Airway & Oxygen Therapy: Patient Spontanous Breathing and Patient connected to face mask oxygen  Post-op Assessment: Report given to RN, Post -op Vital signs reviewed and stable and Patient moving all extremities  Post vital signs: Reviewed and stable  Last Vitals:  Vitals Value Taken Time  BP 135/71 10/26/20 1546  Temp    Pulse 70 10/26/20 1551  Resp 16 10/26/20 1551  SpO2 98 % 10/26/20 1551  Vitals shown include unvalidated device data.  Last Pain:  Vitals:   10/26/20 1240  TempSrc: Tympanic  PainSc: 0-No pain         Complications: No complications documented.

## 2020-10-26 NOTE — Anesthesia Postprocedure Evaluation (Signed)
Anesthesia Post Note  Patient: Olivia Randolph  Procedure(s) Performed: BREAST BIOPSY WITH RADIO FREQUENCY LOCALIZER (Left Breast)  Patient location during evaluation: PACU Anesthesia Type: General Level of consciousness: awake and alert Pain management: pain level controlled Vital Signs Assessment: post-procedure vital signs reviewed and stable Respiratory status: spontaneous breathing, nonlabored ventilation and respiratory function stable Cardiovascular status: blood pressure returned to baseline and stable Postop Assessment: no apparent nausea or vomiting Anesthetic complications: no   No complications documented.   Last Vitals:  Vitals:   10/26/20 1615 10/26/20 1633  BP: 127/69 (!) 145/78  Pulse: 64 71  Resp: 15 18  Temp: (!) 36.2 C 36.6 C  SpO2: 95% 96%    Last Pain:  Vitals:   10/26/20 1633  TempSrc: Temporal  PainSc: 0-No pain                 Brett Canales Myleigh Amara

## 2020-10-26 NOTE — Anesthesia Procedure Notes (Signed)
Procedure Name: LMA Insertion Date/Time: 10/26/2020 2:36 PM Performed by: Lorie Apley, CRNA Pre-anesthesia Checklist: Patient identified, Patient being monitored, Timeout performed, Emergency Drugs available and Suction available Patient Re-evaluated:Patient Re-evaluated prior to induction Oxygen Delivery Method: Circle system utilized Preoxygenation: Pre-oxygenation with 100% oxygen Induction Type: IV induction Ventilation: Mask ventilation without difficulty LMA: LMA inserted LMA Size: 4.0 Tube type: Oral Number of attempts: 1 Placement Confirmation: positive ETCO2 and breath sounds checked- equal and bilateral Tube secured with: Tape Dental Injury: Teeth and Oropharynx as per pre-operative assessment

## 2020-10-27 ENCOUNTER — Encounter: Payer: Self-pay | Admitting: Surgery

## 2020-10-30 LAB — SURGICAL PATHOLOGY

## 2020-11-02 ENCOUNTER — Other Ambulatory Visit: Payer: Self-pay | Admitting: General Surgery

## 2020-11-02 DIAGNOSIS — R2231 Localized swelling, mass and lump, right upper limb: Secondary | ICD-10-CM

## 2021-09-04 ENCOUNTER — Other Ambulatory Visit: Payer: Self-pay | Admitting: Nurse Practitioner

## 2021-09-04 DIAGNOSIS — Z1231 Encounter for screening mammogram for malignant neoplasm of breast: Secondary | ICD-10-CM

## 2021-09-28 ENCOUNTER — Other Ambulatory Visit: Payer: Self-pay

## 2021-09-28 ENCOUNTER — Ambulatory Visit
Admission: RE | Admit: 2021-09-28 | Discharge: 2021-09-28 | Disposition: A | Discharge status: Home / Self Care | Payer: 59 | Source: Ambulatory Visit | Attending: Nurse Practitioner | Admitting: Nurse Practitioner

## 2021-09-28 DIAGNOSIS — Z1231 Encounter for screening mammogram for malignant neoplasm of breast: Secondary | ICD-10-CM | POA: Diagnosis present

## 2022-09-18 ENCOUNTER — Other Ambulatory Visit: Payer: Self-pay | Admitting: Nurse Practitioner

## 2022-09-18 DIAGNOSIS — Z1231 Encounter for screening mammogram for malignant neoplasm of breast: Secondary | ICD-10-CM

## 2022-10-18 ENCOUNTER — Ambulatory Visit
Admission: RE | Admit: 2022-10-18 | Discharge: 2022-10-18 | Disposition: A | Discharge status: Home / Self Care | Payer: 59 | Source: Ambulatory Visit | Attending: Nurse Practitioner | Admitting: Nurse Practitioner

## 2022-10-18 DIAGNOSIS — Z1231 Encounter for screening mammogram for malignant neoplasm of breast: Secondary | ICD-10-CM | POA: Diagnosis present

## 2023-02-18 ENCOUNTER — Ambulatory Visit: Payer: 59

## 2023-02-18 DIAGNOSIS — K640 First degree hemorrhoids: Secondary | ICD-10-CM

## 2023-02-18 DIAGNOSIS — Z1211 Encounter for screening for malignant neoplasm of colon: Secondary | ICD-10-CM

## 2023-02-18 DIAGNOSIS — Z83719 Family history of colon polyps, unspecified: Secondary | ICD-10-CM

## 2023-02-18 IMAGING — MG MM BREAST LOCALIZATION CLIP
3 series · 3 of 7 positions shown · non-contrast
Comparison: Previous exam(s).

CLINICAL DATA: Evaluate post biopsy marker placement
ultrasound-guided core needle biopsy of a breast mass.

EXAM:
DIAGNOSTIC LEFT MAMMOGRAM POST ULTRASOUND BIOPSY

[L ML synth-2D]
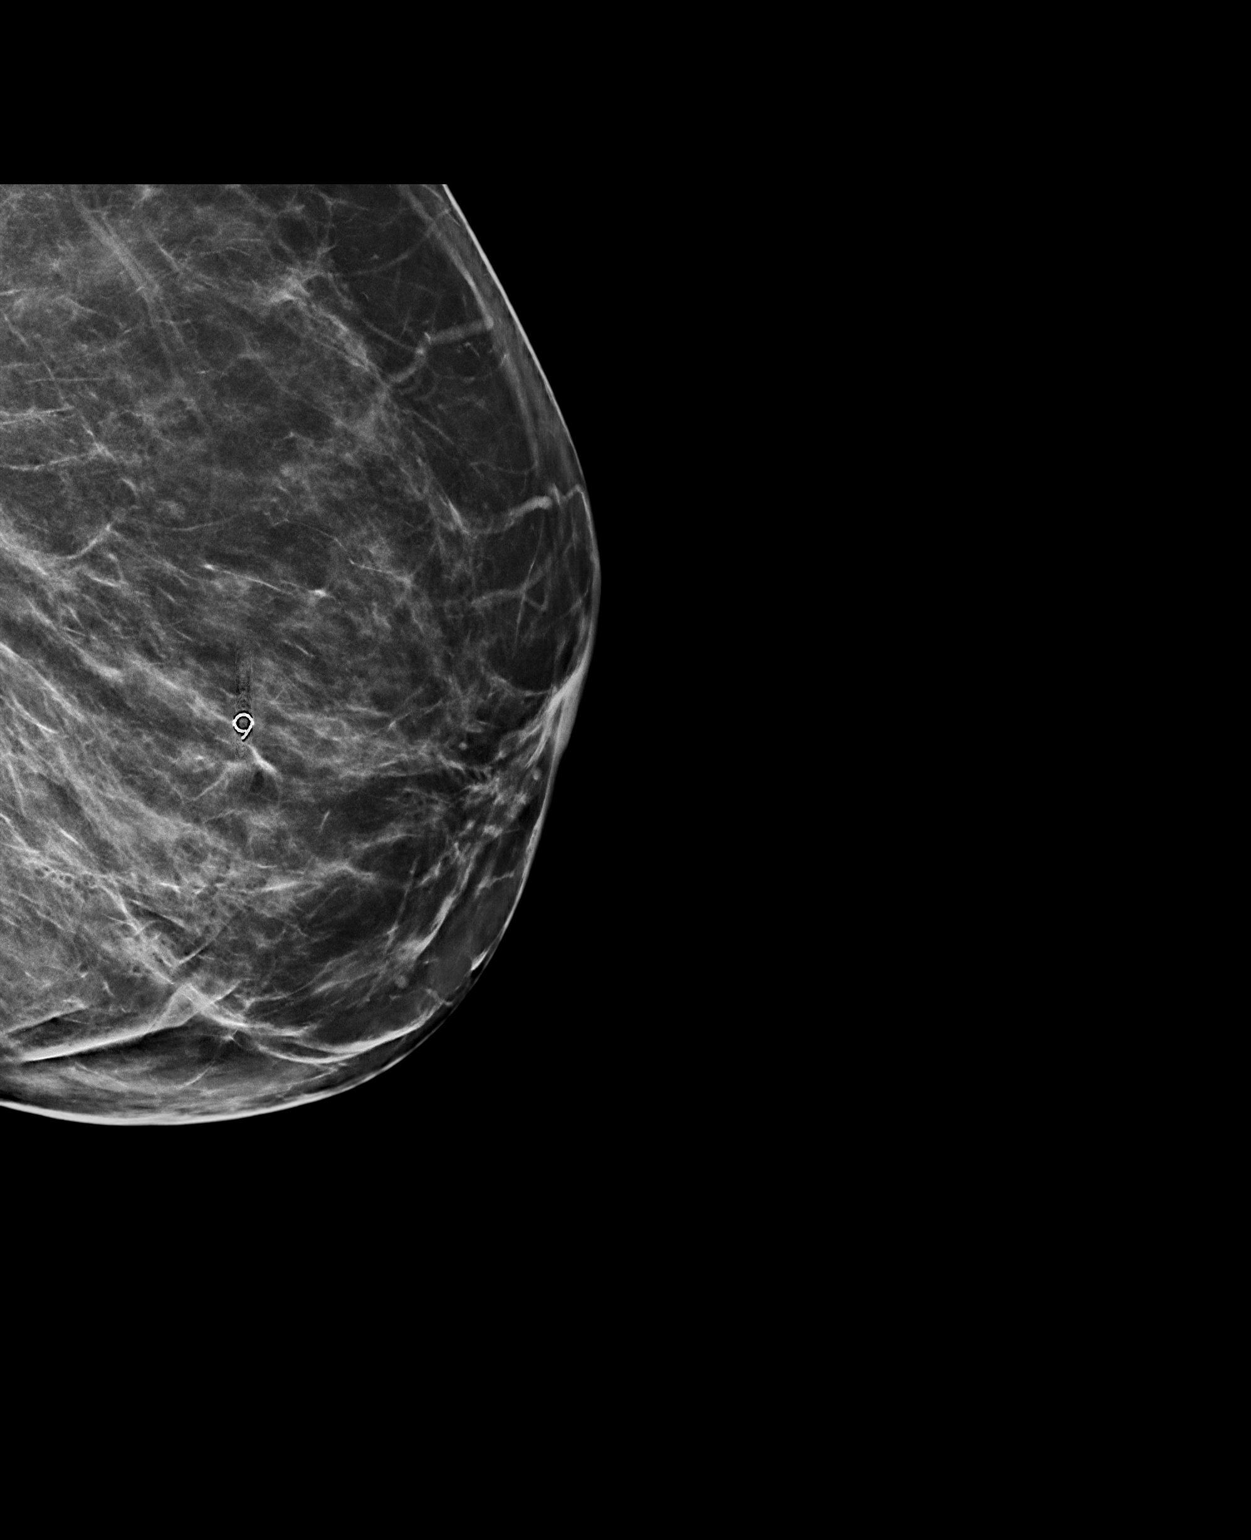

[L CC synth-2D]
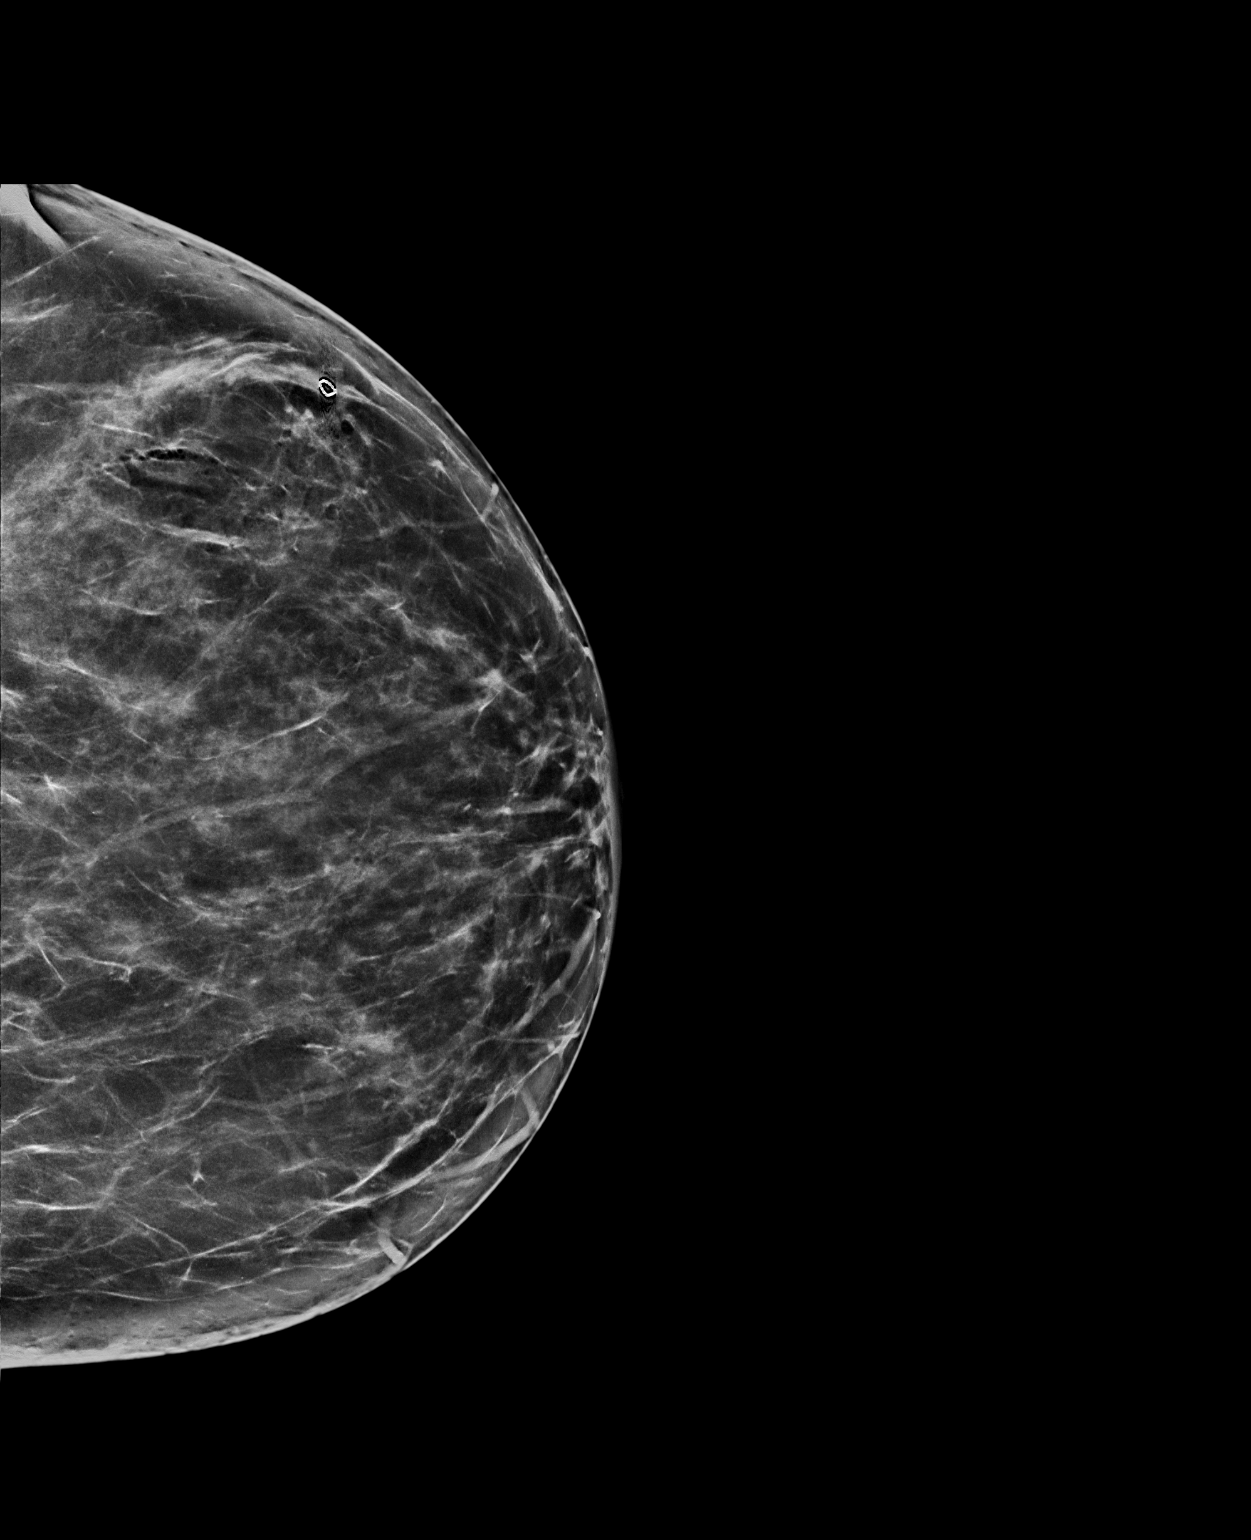

[L ML tomo · tomo slice 44/87.0]
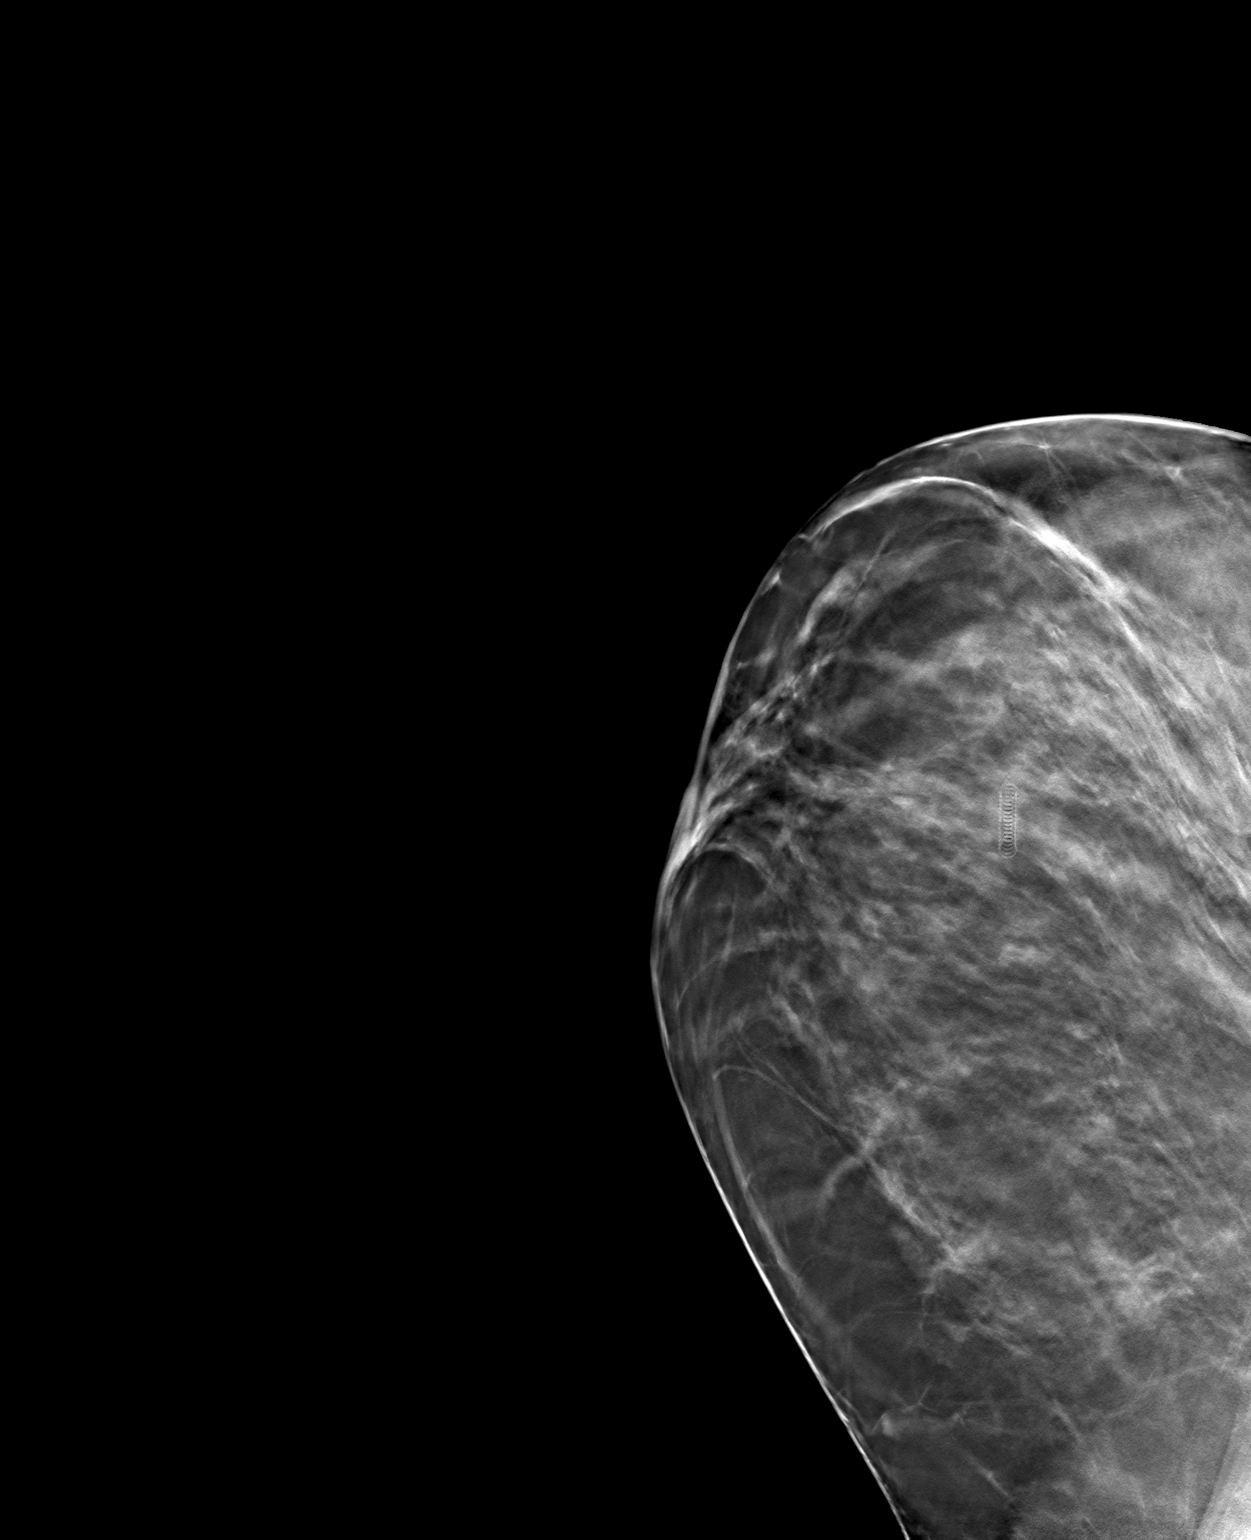

[3 of 7 positions shown; findings below may reference images not displayed]

FINDINGS: Mammographic images were obtained following ultrasound guided biopsy
of a left breast mass. The biopsy marking clip is in expected
position at the site of biopsy.
IMPRESSION: Appropriate positioning of the Q shaped biopsy marking clip at the
site of biopsy in the lateral left breast the expected location
small mass.

Final Assessment: Post Procedure Mammograms for Marker Placement

## 2023-09-19 ENCOUNTER — Other Ambulatory Visit: Payer: Self-pay | Admitting: Nurse Practitioner

## 2023-09-19 DIAGNOSIS — Z1231 Encounter for screening mammogram for malignant neoplasm of breast: Secondary | ICD-10-CM

## 2023-10-21 ENCOUNTER — Ambulatory Visit
Admission: RE | Admit: 2023-10-21 | Discharge: 2023-10-21 | Disposition: A | Discharge status: Home / Self Care | Source: Ambulatory Visit | Attending: Nurse Practitioner | Admitting: Nurse Practitioner

## 2023-10-21 DIAGNOSIS — Z1231 Encounter for screening mammogram for malignant neoplasm of breast: Secondary | ICD-10-CM | POA: Diagnosis present
